# Patient Record
Sex: Female | Born: 1965 | Hispanic: No | Marital: Married | Smoking: Never smoker
Health system: Southern US, Community
[De-identification: ages and names within clinical notes are randomized; demographics above are authoritative.]

## PROBLEM LIST (undated history)

## (undated) DIAGNOSIS — I10 Essential (primary) hypertension: Secondary | ICD-10-CM

---

## 2019-07-01 ENCOUNTER — Observation Stay (HOSPITAL_BASED_OUTPATIENT_CLINIC_OR_DEPARTMENT_OTHER)
Admission: EM | Admit: 2019-07-01 | Discharge: 2019-07-02 | Disposition: A | Discharge status: Home / Self Care | Payer: Self-pay | Attending: Family Medicine | Admitting: Family Medicine

## 2019-07-01 ENCOUNTER — Other Ambulatory Visit: Payer: Self-pay

## 2019-07-01 ENCOUNTER — Encounter (HOSPITAL_BASED_OUTPATIENT_CLINIC_OR_DEPARTMENT_OTHER): Payer: Self-pay | Admitting: Emergency Medicine

## 2019-07-01 ENCOUNTER — Emergency Department (HOSPITAL_BASED_OUTPATIENT_CLINIC_OR_DEPARTMENT_OTHER): Payer: Self-pay

## 2019-07-01 DIAGNOSIS — Z79899 Other long term (current) drug therapy: Secondary | ICD-10-CM | POA: Insufficient documentation

## 2019-07-01 DIAGNOSIS — Z8616 Personal history of COVID-19: Secondary | ICD-10-CM | POA: Insufficient documentation

## 2019-07-01 DIAGNOSIS — A419 Sepsis, unspecified organism: Principal | ICD-10-CM | POA: Insufficient documentation

## 2019-07-01 DIAGNOSIS — R6510 Systemic inflammatory response syndrome (SIRS) of non-infectious origin without acute organ dysfunction: Secondary | ICD-10-CM | POA: Diagnosis present

## 2019-07-01 DIAGNOSIS — E860 Dehydration: Secondary | ICD-10-CM | POA: Diagnosis present

## 2019-07-01 DIAGNOSIS — N390 Urinary tract infection, site not specified: Secondary | ICD-10-CM | POA: Diagnosis present

## 2019-07-01 DIAGNOSIS — Z20822 Contact with and (suspected) exposure to covid-19: Secondary | ICD-10-CM | POA: Insufficient documentation

## 2019-07-01 DIAGNOSIS — N12 Tubulo-interstitial nephritis, not specified as acute or chronic: Secondary | ICD-10-CM | POA: Insufficient documentation

## 2019-07-01 DIAGNOSIS — R1013 Epigastric pain: Secondary | ICD-10-CM

## 2019-07-01 DIAGNOSIS — I10 Essential (primary) hypertension: Secondary | ICD-10-CM | POA: Diagnosis present

## 2019-07-01 DIAGNOSIS — R509 Fever, unspecified: Secondary | ICD-10-CM | POA: Diagnosis present

## 2019-07-01 HISTORY — DX: Essential (primary) hypertension: I10

## 2019-07-01 LAB — CBC WITH DIFFERENTIAL/PLATELET
Abs Immature Granulocytes: 0.1 10*3/uL — ABNORMAL HIGH (ref 0.00–0.07)
Basophils Absolute: 0 10*3/uL (ref 0.0–0.1)
Basophils Relative: 0 %
Eosinophils Absolute: 0.1 10*3/uL (ref 0.0–0.5)
Eosinophils Relative: 1 %
HCT: 39.2 % (ref 36.0–46.0)
Hemoglobin: 12.9 g/dL (ref 12.0–15.0)
Immature Granulocytes: 1 %
Lymphocytes Relative: 9 %
Lymphs Abs: 0.9 10*3/uL (ref 0.7–4.0)
MCH: 26.8 pg (ref 26.0–34.0)
MCHC: 32.9 g/dL (ref 30.0–36.0)
MCV: 81.3 fL (ref 80.0–100.0)
Monocytes Absolute: 0.1 10*3/uL (ref 0.1–1.0)
Monocytes Relative: 1 %
Neutro Abs: 8.4 10*3/uL — ABNORMAL HIGH (ref 1.7–7.7)
Neutrophils Relative %: 88 %
Platelets: 213 10*3/uL (ref 150–400)
RBC: 4.82 MIL/uL (ref 3.87–5.11)
RDW: 13.7 % (ref 11.5–15.5)
WBC: 9.5 10*3/uL (ref 4.0–10.5)
nRBC: 0 % (ref 0.0–0.2)

## 2019-07-01 LAB — PROTIME-INR
INR: 1 (ref 0.8–1.2)
Prothrombin Time: 13.4 seconds (ref 11.4–15.2)

## 2019-07-01 LAB — COMPREHENSIVE METABOLIC PANEL
ALT: 22 U/L (ref 0–44)
ALT: 20 U/L (ref 0–44)
AST: 24 U/L (ref 15–41)
AST: 20 U/L (ref 15–41)
Albumin: 3.2 g/dL — ABNORMAL LOW (ref 3.5–5.0)
Albumin: 2.6 g/dL — ABNORMAL LOW (ref 3.5–5.0)
Alkaline Phosphatase: 50 U/L (ref 38–126)
Alkaline Phosphatase: 38 U/L (ref 38–126)
Anion gap: 13 (ref 5–15)
Anion gap: 7 (ref 5–15)
BUN: 21 mg/dL — ABNORMAL HIGH (ref 6–20)
BUN: 13 mg/dL (ref 6–20)
CO2: 27 mmol/L (ref 22–32)
CO2: 23 mmol/L (ref 22–32)
Calcium: 9.1 mg/dL (ref 8.9–10.3)
Calcium: 7.7 mg/dL — ABNORMAL LOW (ref 8.9–10.3)
Chloride: 94 mmol/L — ABNORMAL LOW (ref 98–111)
Chloride: 112 mmol/L — ABNORMAL HIGH (ref 98–111)
Creatinine, Ser: 1.03 mg/dL — ABNORMAL HIGH (ref 0.44–1.00)
Creatinine, Ser: 0.76 mg/dL (ref 0.44–1.00)
GFR calc Af Amer: >60 mL/min (ref >60)
GFR calc Af Amer: >60 mL/min (ref >60)
GFR calc non Af Amer: >60 mL/min (ref >60)
GFR calc non Af Amer: >60 mL/min (ref >60)
Glucose, Bld: 108 mg/dL — ABNORMAL HIGH (ref 70–99)
Glucose, Bld: 125 mg/dL — ABNORMAL HIGH (ref 70–99)
Potassium: 3.6 mmol/L (ref 3.5–5.1)
Potassium: 3.4 mmol/L — ABNORMAL LOW (ref 3.5–5.1)
Sodium: 134 mmol/L — ABNORMAL LOW (ref 135–145)
Sodium: 142 mmol/L (ref 135–145)
Total Bilirubin: 1.5 mg/dL — ABNORMAL HIGH (ref 0.3–1.2)
Total Bilirubin: 1 mg/dL (ref 0.3–1.2)
Total Protein: 6.7 g/dL (ref 6.5–8.1)
Total Protein: 5.2 g/dL — ABNORMAL LOW (ref 6.5–8.1)

## 2019-07-01 LAB — URINALYSIS, ROUTINE W REFLEX MICROSCOPIC
Bilirubin Urine: NEGATIVE
Glucose, UA: NEGATIVE mg/dL
Ketones, ur: NEGATIVE mg/dL
Nitrite: NEGATIVE
Protein, ur: NEGATIVE mg/dL
Specific Gravity, Urine: <1.005 — ABNORMAL LOW (ref 1.005–1.030)
pH: 7 (ref 5.0–8.0)

## 2019-07-01 LAB — PREGNANCY, URINE: Preg Test, Ur: NEGATIVE

## 2019-07-01 LAB — SARS CORONAVIRUS 2 (TAT 6-24 HRS): SARS Coronavirus 2: NEGATIVE

## 2019-07-01 LAB — MAGNESIUM: Magnesium: 1.7 mg/dL (ref 1.7–2.4)

## 2019-07-01 LAB — CK: Total CK: 107 U/L (ref 38–234)

## 2019-07-01 LAB — LACTIC ACID, PLASMA
Lactic Acid, Venous: 2.1 mmol/L (ref 0.5–1.9)
Lactic Acid, Venous: 1.2 mmol/L (ref 0.5–1.9)
Lactic Acid, Venous: 0.9 mmol/L (ref 0.5–1.9)
Lactic Acid, Venous: 0.8 mmol/L (ref 0.5–1.9)

## 2019-07-01 LAB — APTT: aPTT: 35 seconds (ref 24–36)

## 2019-07-01 LAB — URINALYSIS, MICROSCOPIC (REFLEX): WBC, UA: >50 WBC/hpf (ref 0–5)

## 2019-07-01 LAB — SARS CORONAVIRUS 2 AG (30 MIN TAT): SARS Coronavirus 2 Ag: NEGATIVE

## 2019-07-01 LAB — GLUCOSE, CAPILLARY: Glucose-Capillary: 96 mg/dL (ref 70–99)

## 2019-07-01 LAB — PROCALCITONIN: Procalcitonin: 14.98 ng/mL

## 2019-07-01 LAB — MRSA PCR SCREENING: MRSA by PCR: NEGATIVE

## 2019-07-01 LAB — LIPASE, BLOOD: Lipase: 27 U/L (ref 11–51)

## 2019-07-01 MED ORDER — ACETAMINOPHEN 500 MG PO TABS
1000.0000 mg | ORAL_TABLET | Freq: Once | ORAL | Status: AC
  Administered 2019-07-01: 1000 mg via ORAL
  Filled 2019-07-01: qty 2

## 2019-07-01 MED ORDER — INSULIN ASPART 100 UNIT/ML ~~LOC~~ SOLN
0.0000 [IU] | Freq: Three times a day (TID) | SUBCUTANEOUS | Status: DC
  Administered 2019-07-02 (×3): 1 [IU] via SUBCUTANEOUS

## 2019-07-01 MED ORDER — SODIUM CHLORIDE 0.9 % IV SOLN: Freq: Once | INTRAVENOUS | Status: AC

## 2019-07-01 MED ORDER — VANCOMYCIN HCL 10 G IV SOLR: 800.0000 mg | Freq: Once | INTRAVENOUS | Status: DC

## 2019-07-01 MED ORDER — SODIUM CHLORIDE 0.9 % IV BOLUS
1000.0000 mL | Freq: Once | INTRAVENOUS | Status: AC
  Administered 2019-07-01: 17:00:00 1000 mL via INTRAVENOUS

## 2019-07-01 MED ORDER — METRONIDAZOLE IN NACL 5-0.79 MG/ML-% IV SOLN
500.0000 mg | Freq: Once | INTRAVENOUS | Status: AC
  Administered 2019-07-01: 500 mg via INTRAVENOUS
  Filled 2019-07-01: qty 100

## 2019-07-01 MED ORDER — NOREPINEPHRINE 4 MG/250ML-% IV SOLN
2.0000 ug/min | INTRAVENOUS | Status: DC
  Administered 2019-07-01: 2 ug/min via INTRAVENOUS
  Filled 2019-07-01: qty 250

## 2019-07-01 MED ORDER — SODIUM CHLORIDE 0.9 % IV SOLN
INTRAVENOUS | Status: DC | PRN
  Administered 2019-07-01: 1000 mL via INTRAVENOUS

## 2019-07-01 MED ORDER — ACETAMINOPHEN 325 MG PO TABS
650.0000 mg | ORAL_TABLET | Freq: Four times a day (QID) | ORAL | Status: DC | PRN
  Administered 2019-07-02: 650 mg via ORAL
  Filled 2019-07-01: qty 2

## 2019-07-01 MED ORDER — ENOXAPARIN SODIUM 40 MG/0.4ML ~~LOC~~ SOLN
40.0000 mg | SUBCUTANEOUS | Status: DC
  Administered 2019-07-01: 22:00:00 40 mg via SUBCUTANEOUS
  Filled 2019-07-01: qty 0.4

## 2019-07-01 MED ORDER — SODIUM CHLORIDE 0.9 % IV SOLN
1.0000 g | INTRAVENOUS | Status: DC
  Administered 2019-07-01: 22:00:00 1 g via INTRAVENOUS
  Filled 2019-07-01 (×2): qty 10

## 2019-07-01 MED ORDER — SODIUM CHLORIDE 0.9 % IV BOLUS (SEPSIS)
1000.0000 mL | Freq: Once | INTRAVENOUS | Status: AC
  Administered 2019-07-01: 1000 mL via INTRAVENOUS

## 2019-07-01 MED ORDER — VANCOMYCIN HCL 500 MG IV SOLR
INTRAVENOUS | Status: AC
  Filled 2019-07-01: qty 500

## 2019-07-01 MED ORDER — SODIUM CHLORIDE 0.9 % IV SOLN
250.0000 mL | INTRAVENOUS | Status: DC
  Administered 2019-07-01: 250 mL via INTRAVENOUS

## 2019-07-01 MED ORDER — HYDROCODONE-ACETAMINOPHEN 5-325 MG PO TABS: 1.0000 | ORAL_TABLET | ORAL | Status: DC | PRN

## 2019-07-01 MED ORDER — SODIUM CHLORIDE 0.9 % IV SOLN: INTRAVENOUS | Status: DC

## 2019-07-01 MED ORDER — VANCOMYCIN HCL 1500 MG/300ML IV SOLN
1500.0000 mg | Freq: Once | INTRAVENOUS | Status: AC
  Administered 2019-07-01: 09:00:00 1500 mg via INTRAVENOUS
  Filled 2019-07-01: qty 300

## 2019-07-01 MED ORDER — ONDANSETRON HCL 4 MG/2ML IJ SOLN: 4.0000 mg | Freq: Four times a day (QID) | INTRAMUSCULAR | Status: DC | PRN

## 2019-07-01 MED ORDER — SODIUM CHLORIDE 0.9 % IV SOLN
2.0000 g | Freq: Three times a day (TID) | INTRAVENOUS | Status: DC
  Administered 2019-07-01: 14:00:00 2 g via INTRAVENOUS
  Filled 2019-07-01: qty 2

## 2019-07-01 MED ORDER — CHLORHEXIDINE GLUCONATE CLOTH 2 % EX PADS
6.0000 | MEDICATED_PAD | Freq: Every day | CUTANEOUS | Status: DC
  Administered 2019-07-01 – 2019-07-02 (×2): 6 via TOPICAL

## 2019-07-01 MED ORDER — ACETAMINOPHEN 650 MG RE SUPP: 650.0000 mg | Freq: Four times a day (QID) | RECTAL | Status: DC | PRN

## 2019-07-01 MED ORDER — SODIUM CHLORIDE 0.9% FLUSH
3.0000 mL | Freq: Two times a day (BID) | INTRAVENOUS | Status: DC
  Administered 2019-07-01 – 2019-07-02 (×2): 3 mL via INTRAVENOUS

## 2019-07-01 MED ORDER — VANCOMYCIN HCL IN DEXTROSE 1-5 GM/200ML-% IV SOLN: 1000.0000 mg | Freq: Once | INTRAVENOUS | Status: DC

## 2019-07-01 MED ORDER — VANCOMYCIN HCL 1000 MG IV SOLR
INTRAVENOUS | Status: AC
  Filled 2019-07-01: qty 1000

## 2019-07-01 MED ORDER — INSULIN ASPART 100 UNIT/ML ~~LOC~~ SOLN: 0.0000 [IU] | Freq: Every day | SUBCUTANEOUS | Status: DC

## 2019-07-01 MED ORDER — VANCOMYCIN HCL 750 MG/150ML IV SOLN
750.0000 mg | Freq: Two times a day (BID) | INTRAVENOUS | Status: DC
  Filled 2019-07-01: qty 150

## 2019-07-01 MED ORDER — ONDANSETRON HCL 4 MG PO TABS: 4.0000 mg | ORAL_TABLET | Freq: Four times a day (QID) | ORAL | Status: DC | PRN

## 2019-07-01 MED ORDER — VANCOMYCIN HCL 1000 MG IV SOLR
750.0000 mg | Freq: Once | INTRAVENOUS | Status: AC
  Administered 2019-07-01: 17:00:00 750 mg via INTRAVENOUS
  Filled 2019-07-01: qty 750

## 2019-07-01 MED ORDER — ENOXAPARIN SODIUM 30 MG/0.3ML ~~LOC~~ SOLN: 30.0000 mg | SUBCUTANEOUS | Status: DC

## 2019-07-01 MED ORDER — SODIUM CHLORIDE 0.9 % IV SOLN
2.0000 g | Freq: Once | INTRAVENOUS | Status: AC
  Administered 2019-07-01: 06:00:00 2 g via INTRAVENOUS
  Filled 2019-07-01: qty 2

## 2019-07-01 MED ORDER — IOHEXOL 300 MG/ML  SOLN
100.0000 mL | Freq: Once | INTRAMUSCULAR | Status: AC | PRN
  Administered 2019-07-01: 09:00:00 100 mL via INTRAVENOUS

## 2019-07-01 NOTE — Progress Notes (Signed)
Pharmacy Antibiotic Note  Laura Beltran is a 54 y.o. female admitted on 07/01/2019 with sepsis -source unknown.  Pharmacy has been consulted for Vancomycin and Cefepime dosing.  Temperature 100.6. Wt - 83.5 kg. WBC is within normal limits.  SCr is 1.03 with estimated CrCl of 84 mL/min.    Plan: Cefepime 2g IV x1 (as ordered) then 2g IV every 8 hours.  Vancomycin 1500 mg IV x1 then Vancomycin 750 mg IV Q 12 hrs.  Goal AUC 400-550. Expected AUC: 516.9 SCr used: 1.03 Monitor renal function, clinical status, and culture results   Weight: 83.5 kg (184 lb 1.4 oz)  Temp (24hrs), Avg:100.6 F (38.1 C), Min:100.6 F (38.1 C), Max:100.6 F (38.1 C)  No results for input(s): WBC, CREATININE, LATICACIDVEN, VANCOTROUGH, VANCOPEAK, VANCORANDOM, GENTTROUGH, GENTPEAK, GENTRANDOM, TOBRATROUGH, TOBRAPEAK, TOBRARND, AMIKACINPEAK, AMIKACINTROU, AMIKACIN in the last 168 hours.  CrCl cannot be calculated (No successful lab value found.).    No Known Allergies  Antimicrobials this admission: Vancomycin 4/20 >> Cefepime 4/20 >>  Dose adjustments this admission:   Microbiology results: 4/20 BCx:  4/20 UCx:   Thank you for allowing pharmacy to be a part of this patient's care.  Link Snuffer, PharmD, BCPS, BCCCP Clinical Pharmacist Please refer to [facility] for [facility] Pharmacy numbers 07/01/2019 5:44 AM

## 2019-07-01 NOTE — ED Notes (Signed)
Pt using bedside commode, able to stand/pivot with standby assist

## 2019-07-01 NOTE — ED Notes (Signed)
ED Provider at bedside. 

## 2019-07-01 NOTE — ED Provider Notes (Signed)
[facility] EMERGENCY DEPARTMENT Provider Note   CSN: [id number] Arrival date & time: 07/01/19  5726     History Chief Complaint  Patient presents with  . Fever    Laura Beltran is a 54 y.o. female.  HPI     This a 54 year old female with history of hypertension who presents with fever and chills.  Family member at the bedside translates.  Reports that she received her Covid vaccination 4 days ago.  She felt chilled all day yesterday.  She also reported epigastric abdominal pain that is nonradiating.  Not worsened with eating.  No nausea, vomiting, or diarrhea.  No cough or shortness of breath.  She was given over-the-counter fever reducer with some improvement of her symptoms.  Patient is unable to rate her pain for me at this time.  Pain is not worsened with food and seems to come and go.  Patient denies urinary symptoms or back pain.  Of note, patient did have COVID-19 in the summer of last year.  Past Medical History:  Diagnosis Date  . Hypertension     There are no problems to display for this patient.   History reviewed. No pertinent surgical history.   OB History   No obstetric history on file.     No family history on file.  Social History   Tobacco Use  . Smoking status: Never Smoker  . Smokeless tobacco: Never Used  Substance Use Topics  . Alcohol use: Never  . Drug use: Never    Home Medications Prior to Admission medications   Not on File    Allergies    Patient has no known allergies.  Review of Systems   Review of Systems  Constitutional: Positive for chills and fever.  Respiratory: Negative for cough and shortness of breath.   Cardiovascular: Negative for chest pain.  Gastrointestinal: Positive for abdominal pain. Negative for constipation, diarrhea, nausea and vomiting.  Genitourinary: Positive for dysuria.  Neurological: Negative for weakness.  All other systems reviewed and are negative.   Physical Exam Updated Vital  Signs BP (!) 97/46   Pulse (!) 101   Temp (!) 100.6 F (38.1 C) (Oral)   Resp 17   Ht 1.6 m (5\' 3" )   Wt 83.5 kg   SpO2 98%   BMI 32.61 kg/m   Physical Exam Vitals and nursing note reviewed.  Constitutional:      Appearance: She is well-developed. She is not ill-appearing.  HENT:     Head: Normocephalic and atraumatic.     Nose: Nose normal.     Mouth/Throat:     Mouth: Mucous membranes are moist.  Eyes:     Pupils: Pupils are equal, round, and reactive to light.  Cardiovascular:     Rate and Rhythm: Normal rate and regular rhythm.     Heart sounds: Normal heart sounds.  Pulmonary:     Effort: Pulmonary effort is normal. No respiratory distress.     Breath sounds: No wheezing.  Abdominal:     General: Bowel sounds are normal.     Palpations: Abdomen is soft.     Tenderness: There is abdominal tenderness. There is no guarding or rebound.  Musculoskeletal:     Cervical back: Neck supple.     Right lower leg: No edema.     Left lower leg: No edema.  Skin:    General: Skin is warm and dry.  Neurological:     Mental Status: She is alert and oriented to  person, place, and time.  Psychiatric:        Mood and Affect: Mood normal.     ED Results / Procedures / Treatments   Labs (all labs ordered are listed, but only abnormal results are displayed) Labs Reviewed  LACTIC ACID, PLASMA - Abnormal; Notable for the following components:      Result Value   Lactic Acid, Venous 2.1 (*)    All other components within normal limits  COMPREHENSIVE METABOLIC PANEL - Abnormal; Notable for the following components:   Sodium 134 (*)    Chloride 94 (*)    Glucose, Bld 108 (*)    BUN 21 (*)    Creatinine, Ser 1.03 (*)    Albumin 3.2 (*)    Total Bilirubin 1.5 (*)    All other components within normal limits  CBC WITH DIFFERENTIAL/PLATELET - Abnormal; Notable for the following components:   Neutro Abs 8.4 (*)    Abs Immature Granulocytes 0.10 (*)    All other components within  normal limits  CULTURE, BLOOD (ROUTINE X 2)  CULTURE, BLOOD (ROUTINE X 2)  URINE CULTURE  SARS CORONAVIRUS 2 AG (30 MIN TAT)  APTT  PROTIME-INR  LIPASE, BLOOD  LACTIC ACID, PLASMA  URINALYSIS, ROUTINE W REFLEX MICROSCOPIC  PREGNANCY, URINE    EKG EKG Interpretation  Date/Time:  Tuesday July 01 2019 06:05:22 EDT Ventricular Rate:  108 PR Interval:    QRS Duration: 77 QT Interval:  354 QTC Calculation: 475 R Axis:   68 Text Interpretation: Sinus tachycardia Confirmed by Thayer Jew [phone]) on 07/01/2019 6:25:12 AM   Radiology No results found.  Procedures Procedures (including critical care time)  CRITICAL CARE Performed by: Merryl Hacker   Total critical care time:40 minutes  Critical care time was exclusive of separately billable procedures and treating other patients.  Critical care was necessary to treat or prevent imminent or life-threatening deterioration.  Critical care was time spent personally by me on the following activities: development of treatment plan with patient and/or surrogate as well as nursing, discussions with consultants, evaluation of patient's response to treatment, examination of patient, obtaining history from patient or surrogate, ordering and performing treatments and interventions, ordering and review of laboratory studies, ordering and review of radiographic studies, pulse oximetry and re-evaluation of patient's condition.   Medications Ordered in ED Medications  sodium chloride 0.9 % bolus 1,000 mL (0 mLs Intravenous Stopped 07/01/19 0641)    And  sodium chloride 0.9 % bolus 1,000 mL (1,000 mLs Intravenous New Bag/Given 07/01/19 6295)    And  sodium chloride 0.9 % bolus 1,000 mL (1,000 mLs Intravenous New Bag/Given 07/01/19 0642)  metroNIDAZOLE (FLAGYL) IVPB 500 mg (500 mg Intravenous New Bag/Given 07/01/19 0645)  vancomycin (VANCOREADY) IVPB 1500 mg/300 mL (has no administration in time range)  vancomycin (VANCOREADY) IVPB 750  mg/150 mL (has no administration in time range)  ceFEPIme (MAXIPIME) 2 g in sodium chloride 0.9 % 100 mL IVPB (0 g Intravenous Stopped 07/01/19 0641)    ED Course  I have reviewed the triage vital signs and the nursing notes.  Pertinent labs & imaging results that were available during my care of the patient were reviewed by me and considered in my medical decision making (see chart for details).  Clinical Course as of Jun 30 2308  Tue Jul 01, 2019  0706 Patient reports she feels somewhat better.  No longer having abdominal pain.  Reports that she can provide a urine sample.  She has some  persistently soft blood pressures.  She is awake, alert, oriented.  Lactate is only 2.1.  Will request manual blood pressure to correspond.  Abdomen is benign.   [CH]  [id number] CT abdomen and pelvis interpreted by me as no gross abnormalities.  Awaiting radiology reading.   [MB]  1032 Final CT reading showing no acute findings.  Have paged the hospitalist regarding admission.  Levophed being started.   [MB]  1051 Discussed with Dr. Magdalen Spatz [facility] hospitalist.  He he will admit the patient.  He does not feel the patient needs pressors currently.   [MB]    Clinical Course User Index [CH] Tarry Fountain, Mayer Masker, MD [MB] Terrilee Files, MD   MDM Rules/Calculators/A&P                       Patient presents with chills and abdominal pain.  On initial evaluation she is febrile to 100.6, hypotensive, tachycardic.  For this reason, sepsis work-up was initiated.  She has mild tenderness to palpation in the right upper quadrant and epigastric region, no rebound or guarding or signs of peritonitis.  She was given 30 cc/kg fluid and broad-spectrum antibiotics given her hypotension.  Lactate is 2.1.  Initial lab work notable for slight metabolic derangement with slightly low sodium, chloride and slightly elevated creatinine, no baseline for comparison.  No significant leukocytosis.  Awaiting chest x-ray and urinalysis.  On my  wet read of chest x-ray, no obvious pneumonia or infiltrate, no pneumothorax.  If these are negative, patient may need abdominal imaging to rule out infection.  Final Clinical Impression(s) / ED Diagnoses Final diagnoses:  Febrile illness  Epigastric pain    Rx / DC Orders ED Discharge Orders    None       Shon Baton, MD 07/01/19 2310

## 2019-07-01 NOTE — ED Notes (Signed)
Pt's native language is [employer], museum

## 2019-07-01 NOTE — ED Notes (Signed)
Patient transported to CT 

## 2019-07-01 NOTE — ED Triage Notes (Signed)
Pt with fever and chills. Pt had Covid vaccine x 4 days ago.

## 2019-07-01 NOTE — ED Provider Notes (Signed)
Signout from Dr. Dina Rich.  54 year old female history of hypertension here with fever.  Did have Covid vaccine 4 days ago.  Some mild epigastric discomfort.  Blood pressure was low here and lactate mildly elevated at 2.1.  Getting sepsis fluids and empiric antibiotics.  Plan is to follow-up on pending labs including UA.  Possible abdominal CT.  Will likely need admission for further management. Physical Exam  BP (!) 90/49   Pulse 96   Temp (!) 100.6 F (38.1 C) (Oral)   Resp (!) 26   Ht 5' 3"  (1.6 m)   Wt 83.5 kg   SpO2 99%   BMI 32.61 kg/m   Physical Exam  ED Course/Procedures   Clinical Course as of Jun 30 1157  Tue Jul 01, 2019  0706 Patient reports she feels somewhat better.  No longer having abdominal pain.  Reports that she can provide a urine sample.  She has some persistently soft blood pressures.  She is awake, alert, oriented.  Lactate is only 2.1.  Will request manual blood pressure to correspond.  Abdomen is benign.   [CH]  [id number] CT abdomen and pelvis interpreted by me as no gross abnormalities.  Awaiting radiology reading.   [MB]  1032 Final CT reading showing no acute findings.  Have paged the hospitalist regarding admission.  Levophed being started.   [MB]  9379 Discussed with Dr. Rance Muir [facility] hospitalist.  He he will admit the patient.  He does not feel the patient needs pressors currently.   [MB]    Clinical Course User Index [CH] Horton, Barbette Hair, MD [MB] Hayden Rasmussen, MD    .Critical Care Performed by: Hayden Rasmussen, MD Authorized by: Hayden Rasmussen, MD   Critical care provider statement:    Critical care time (minutes):  45   Critical care time was exclusive of:  Separately billable procedures and treating other patients   Critical care was necessary to treat or prevent imminent or life-threatening deterioration of the following conditions:  Sepsis   Critical care was time spent personally by me on the following activities:  Discussions with  consultants, evaluation of patient's response to treatment, examination of patient, ordering and performing treatments and interventions, ordering and review of laboratory studies, pulse oximetry, re-evaluation of patient's condition, obtaining history from patient or surrogate, development of treatment plan with patient or surrogate, ordering and review of radiographic studies and review of old charts   I assumed direction of critical care for this patient from another provider in my specialty: yes      MDM  Urinalysis resulting with greater than 50 whites nitrite negative.  Likely source.  Blood pressure remains low.  Will proceed with CT abdomen and pelvis to evaluate for possible other source.  9 AM.  Blood pressure continues to drop.  Patient not really symptomatic.  She is had almost her full 3 L of sepsis fluids.  We will start peripheral levo.  Met with the patient.  Family member is translating.  Nurse that said she tried to use the iPad interpreter without success.  Patient states she is feeling improved.  She says she still feels dehydrated.  Patient being packed for CT.  She is awake and alert.  She says her blood pressure is usually 140 at her doctor's office.  She takes a pill for sugar? and a pill for blood pressure and some type of blood thinner as preventative since Covid.  These medications are from [place].  Nurse trying to  reconcile her meds.  Patient cleared her lactate despite low blood pressures.  3:30 PM-patient was signed out to Dr. Rex Kras with plan for admission to [facility].  Antibiotics continuing.  Blood pressures remained stable off of pressors now.       Hayden Rasmussen, MD 07/01/19 1655

## 2019-07-01 NOTE — H&P (Signed)
Laura Beltran ZYS:[id number] DOB: December 24, 1965 DOA: 07/01/2019     PCP: Patient, No Pcp Per   Outpatient Specialists: NONE    Patient arrived to ER on 07/01/19 at 0517  Patient coming from: home Lives   With family    Chief Complaint:  Chief Complaint  Patient presents with  . Fever    HPI: Laura Beltran is a 54 y.o. female with medical history significant of hypertension, Covid-19 in July 2020, DM2   Presented with fever chills 4 days after receiving Covid vaccine on April 15 24h later she had some abdominal pain, had significant chills,  reports epigastric pain no nausea vomiting or diarrhea no cough no shortness of breath she has tried over-the-counter fever reducer and that has improved somewhat no urinary complaints no back pain, no dysuria Patient did have history of COVID-19 last summer she rosloycv (Rosuvastatin+Clopidogrel) was on hold 4 days prior to shot  Infectious risk factors:  Reports fever, abdominal pain,  Body aches,    Has  been vaccinated against COVID 1 shot   In  ER    PCR testing    COVID TEST  NEGATIVE   Lab Results  Component Value Date   [place] NEGATIVE 07/01/2019    Regarding pertinent Chronic problems:    Hyperlipidemia -  on statins   HTN on telma/d Telmisartan (44m) + Indapamide (1.514m      DM 2 - PO meds only,  Glucoryl MV2   Glimepiride (70m73m+ Metformin (500m97m Voglibose (0.70mg)11m obesity-   BMI Readings from Last 1 Encounters:  07/01/19 32.61 kg/m     While in ER: Sepsis protocol initiated started on vancomycin and cefepime and Flagyl Noted to be tachycardic slightly febrile up to 100.5 elevated lactic acid up to 2 Patient was given IV fluid bolus inittialy though to be in septic shock but BP improved with IV fluids No significant leukocytosis Chest x-ray unremarkable CT abdomen unremarkable  Initial hypotension down to 80's and she was started on levophed briefly  Now was able to be weaned  off  Hospitalist was called for admission for SIRS and dehydration due yo UTI  The following Work up has been ordered so far:  Orders Placed This Encounter  Procedures  . Critical Care  . Blood Culture (routine x 2)  . Urine culture  . SARS Coronavirus 2 Ag (30 min TAT) - Nasal Swab (BD Veritor Kit)  . SARS CORONAVIRUS 2 (TAT 6-24 HRS) Nasopharyngeal Nasopharyngeal Swab  . DG Chest Port 1 View  . CT Abdomen Pelvis W Contrast  . Lactic acid, plasma  . Comprehensive metabolic panel  . CBC WITH DIFFERENTIAL  . APTT  . Protime-INR  . Urinalysis, Routine w reflex microscopic  . Pregnancy, urine  . Lipase, blood  . Urinalysis, Microscopic (reflex)  . Diet NPO time specified  . Cardiac monitoring  . Refer to Sidebar Report: Sepsis Sidebar ED/IP  . Document vital signs within 1-hour of fluid bolus completion and notify provider of bolus completion  . Document height and weight  . Insert peripheral IV x 2  . Initiate Carrier Fluid Protocol  . Vasopressor infusion via peripheral IV access  . Assess IV site every 2 hours for vasopressor extravasation  . IV site should be re-confirmed by blood return or ultrasound every 24 hours of vasopressor administration  . Recommended time for peripheral administration of vasopressors is 24 hours  . Initiate Code Sepsis ([facility] [phone] activated, this  will prioritize pharmacy, lab, and radiology services for this patient for STAT collections and interventions.  . pharmacy consult  . ceFEPime (MAXIPIME) per pharmacy consult  . vancomycin per pharmacy consult  . pharmacy consult  . Consult to hospitalist  ALL PATIENTS BEING ADMITTED/HAVING PROCEDURES NEED COVID-19 SCREENING Diagnosis Sepsis  . Airborne and Contact precautions  . Pulse oximetry, continuous  . ED EKG 12-Lead  . EKG 12-Lead  . Place in observation (patient's expected length of stay will be less than 2 midnights)     Following Medications were ordered in  ER: Medications  0.9 %  sodium chloride infusion ( Intravenous Stopped 07/01/19 1316)  0.9 %  sodium chloride infusion (0 mLs Intravenous Stopped 07/01/19 1220)  ceFEPIme (MAXIPIME) 2 g in sodium chloride 0.9 % 100 mL IVPB ( Intravenous Stopped 07/01/19 1450)  norepinephrine (LEVOPHED) '4mg'$  in 272m premix infusion (0 mcg/min Intravenous Stopped 07/01/19 1105)  0.9 %  sodium chloride infusion ( Intravenous Rate/Dose Verify 07/01/19 1508)  vancomycin (VANCOREADY) IVPB 750 mg/150 mL (has no administration in time range)  vancomycin (VANCOCIN) 1000 MG powder (has no administration in time range)  sodium chloride 0.9 % bolus 1,000 mL (0 mLs Intravenous Stopped 07/01/19 0641)    And  sodium chloride 0.9 % bolus 1,000 mL (0 mLs Intravenous Stopped 07/01/19 0757)    And  sodium chloride 0.9 % bolus 1,000 mL (0 mLs Intravenous Stopped 07/01/19 0758)  ceFEPIme (MAXIPIME) 2 g in sodium chloride 0.9 % 100 mL IVPB (0 g Intravenous Stopped 07/01/19 0641)  metroNIDAZOLE (FLAGYL) IVPB 500 mg (0 mg Intravenous Stopped 07/01/19 0759)  vancomycin (VANCOREADY) IVPB 1500 mg/300 mL (0 mg Intravenous Stopped 07/01/19 1124)  iohexol (OMNIPAQUE) 300 MG/ML solution 100 mL (100 mLs Intravenous Contrast Given 07/01/19 0920)  vancomycin (VANCOCIN) 1000 MG powder (  [facility]  vancomycin (VANCOCIN) 500 MG powder (  [facility]  0.9 %  sodium chloride infusion ( Intravenous Stopped 07/01/19 1313)  acetaminophen (TYLENOL) tablet 1,000 mg (1,000 mg Oral Given 07/01/19 1511)  vancomycin (VANCOCIN) 750 mg in sodium chloride 0.9 % 250 mL IVPB (750 mg Intravenous New Bag/Given 07/01/19 1713)  sodium chloride 0.9 % bolus 1,000 mL (1,000 mLs Intravenous New Bag/Given 07/01/19 1711)        Consult Orders  (From admission, onward)         Start     Ordered   07/01/19 1018  Consult to hospitalist  ALL PATIENTS BEING ADMITTED/HAVING PROCEDURES NEED COVID-19 SCREENING Diagnosis Sepsis Called [facility]  spoke with TMarcello Moores Once    Comments: ALL PATIENTS BEING ADMITTED/HAVING PROCEDURES NEED COVID-19 SCREENING  Diagnosis Sepsis  Provider:  (Not yet assigned)  Question Answer Comment  Place call to: [facility]   Reason for Consult Admit      07/01/19 1017           Significant initial  Findings: Abnormal Labs Reviewed  LACTIC ACID, PLASMA - Abnormal; Notable for the following components:      Result Value   Lactic Acid, Venous 2.1 (*)    All other components within normal limits  COMPREHENSIVE METABOLIC PANEL - Abnormal; Notable for the following components:   Sodium 134 (*)    Chloride 94 (*)    Glucose, Bld 108 (*)    BUN 21 (*)    Creatinine, Ser 1.03 (*)    Albumin 3.2 (*)    Total Bilirubin 1.5 (*)    All other components within  normal limits  CBC WITH DIFFERENTIAL/PLATELET - Abnormal; Notable for the following components:   Neutro Abs 8.4 (*)    Abs Immature Granulocytes 0.10 (*)    All other components within normal limits  URINALYSIS, ROUTINE W REFLEX MICROSCOPIC - Abnormal; Notable for the following components:   Specific Gravity, Urine <1.005 (*)    Hgb urine dipstick SMALL (*)    Leukocytes,Ua MODERATE (*)    All other components within normal limits  URINALYSIS, MICROSCOPIC (REFLEX) - Abnormal; Notable for the following components:   Bacteria, UA MANY (*)    All other components within normal limits     Otherwise labs showing:    Recent Labs  Lab 07/01/19 0536  NA 134*  K 3.6  CO2 27  GLUCOSE 108*  BUN 21*  CREATININE 1.03*  CALCIUM 9.1    Cr    stable,   Lab Results  Component Value Date   CREATININE 1.03 (H) 07/01/2019    Recent Labs  Lab 07/01/19 0536  AST 24  ALT 22  ALKPHOS 50  BILITOT 1.5*  PROT 6.7  ALBUMIN 3.2*   Lab Results  Component Value Date   CALCIUM 9.1 07/01/2019      WBC      Component Value Date/Time   WBC 9.5 07/01/2019 0536   ANC    Component Value Date/Time   NEUTROABS 8.4 (H) 07/01/2019  0536    Plt: Lab Results  Component Value Date   PLT 213 07/01/2019    Lactic Acid, Venous    Component Value Date/Time   LATICACIDVEN 1.2 07/01/2019 0904    Procalcitonin 14.98   COVID-19 Labs    Lab Results  Component Value Date   SARSCOV2NAA NEGATIVE 07/01/2019     HG/HCT   stable,       Component Value Date/Time   HGB 12.9 07/01/2019 0536   HCT 39.2 07/01/2019 0536    Recent Labs  Lab 07/01/19 0536  LIPASE 27       ECG: Ordered Personally reviewed by me showing: HR : 108 Rhythm: Sinus tachycardia  no evidence of ischemic changes QTC 475     UA possible UTI      Urine analysis:    Component Value Date/Time   COLORURINE YELLOW 07/01/2019 0730   APPEARANCEUR CLEAR 07/01/2019 0730   LABSPEC <1.005 (L) 07/01/2019 0730   PHURINE 7.0 07/01/2019 0730   GLUCOSEU NEGATIVE 07/01/2019 0730   HGBUR SMALL (A) 07/01/2019 0730   BILIRUBINUR NEGATIVE 07/01/2019 0730   KETONESUR NEGATIVE 07/01/2019 0730   PROTEINUR NEGATIVE 07/01/2019 0730   NITRITE NEGATIVE 07/01/2019 0730   LEUKOCYTESUR MODERATE (A) 07/01/2019 0730       Ordered    CXR -  NON acute  CTabd/pelvis -  nonacute     ED Triage Vitals  Enc Vitals Group     BP 07/01/19 0525 (!) 88/47     Pulse Rate 07/01/19 0525 (!) 115     Resp 07/01/19 0525 16     Temp 07/01/19 0525 (!) 100.6 F (38.1 C)     Temp Source 07/01/19 0525 Oral     SpO2 07/01/19 0525 99 %     Weight 07/01/19 0522 184 lb 1.4 oz (83.5 kg)     Height 07/01/19 0628 _0  (1.6 m)     Head Circumference --      Peak Flow --      Pain Score 07/01/19 0726 0     Pain Loc --  Pain Edu? --      Excl. in [place]? --   TMAX(24)@       Latest  Blood pressure (!) 104/54, pulse 87, temperature 99.8 F (37.7 C), temperature source Oral, resp. rate (!) 24, height _0  (1.6 m), weight 83.5 kg, SpO2 98 %.    Review of Systems:    Pertinent positives include:  , Fevers, chills, fatigue,  dysuria  Constitutional:  No weight loss,  night sweats weight loss  HEENT:  No headaches, Difficulty swallowing,Tooth/dental problems,Sore throat,  No sneezing, itching, ear ache, nasal congestion, post nasal drip,  Cardio-vascular:  No chest pain, Orthopnea, PND, anasarca, dizziness, palpitations.no Bilateral lower extremity swelling  GI:  No heartburn, indigestion, abdominal pain, nausea, vomiting, diarrhea, change in bowel habits, loss of appetite, melena, blood in stool, hematemesis Resp:  no shortness of breath at rest. No dyspnea on exertion, No excess mucus, no productive cough, No non-productive cough, No coughing up of blood.No change in color of mucus.No wheezing. Skin:  no rash or lesions. No jaundice GU:  no, change in color of urine, no urgency or frequency. No straining to urinate.  No flank pain.  Musculoskeletal:  No joint pain or no joint swelling. No decreased range of motion. No back pain.  Psych:  No change in mood or affect. No depression or anxiety. No memory loss.  Neuro: no localizing neurological complaints, no tingling, no weakness, no double vision, no gait abnormality, no slurred speech, no confusion  All systems reviewed and apart from [place] all are negative  Past Medical History:   Past Medical History:  Diagnosis Date  . Hypertension    History reviewed. No pertinent surgical history.  Social History:  Ambulatory   Independently      reports that she has never smoked. She has never used smokeless tobacco. She reports that she does not drink alcohol or use drugs.    Family History:   Family History  Problem Relation Age of Onset  . Hypertension Neg Hx   . Diabetes Neg Hx     Allergies: No Known Allergies   Prior to Admission medications   Medication Sig Start Date End Date Taking? Authorizing Provider  telmisartan (MICARDIS) 40 MG tablet Take 40 mg by mouth daily.   Yes [provider]   Physical Exam: Blood pressure (!) 104/54, pulse 87, temperature 99.8 F (37.7  C), temperature source Oral, resp. rate (!) 24, height _1  (1.6 m), weight 83.5 kg, SpO2 98 %. 1. General:  in No  Acute distress   well-appearing 2. Psychological: Alert and Oriented 3. Head/ENT:    Dry Mucous Membranes                          Head Non traumatic, neck supple                           Poor Dentition 4. SKIN:  decreased Skin turgor,  Skin clean Dry and intact no rash 5. Heart: Regular rate and rhythm no  Murmur, no Rub or gallop 6. Lungs:   no wheezes or crackles   7. Abdomen: Soft, non-tender, Non distended obese   8. Lower extremities: no clubbing, cyanosis, no  edema 9. Neurologically Grossly intact, moving all 4 extremities equally   10. MSK: Normal range of motion   All other LABS:     Recent Labs  Lab 07/01/19 0536  WBC  9.5  NEUTROABS 8.4*  HGB 12.9  HCT 39.2  MCV 81.3  PLT 213     Recent Labs  Lab 07/01/19 0536  NA 134*  K 3.6  CL 94*  CO2 27  GLUCOSE 108*  BUN 21*  CREATININE 1.03*  CALCIUM 9.1     Recent Labs  Lab 07/01/19 0536  AST 24  ALT 22  ALKPHOS 50  BILITOT 1.5*  PROT 6.7  ALBUMIN 3.2*       Cultures: No results found for: SDES, SPECREQUEST, CULT, REPTSTATUS   Radiological Exams on Admission: CT Abdomen Pelvis W Contrast  Result Date: 07/01/2019 CLINICAL DATA:  Fever and chills question abdominal infection/abscess, had COVID vaccine 4 days ago EXAM: CT ABDOMEN AND PELVIS WITH CONTRAST TECHNIQUE: Multidetector CT imaging of the abdomen and pelvis was performed using the standard protocol following bolus administration of intravenous contrast. Sagittal and coronal MPR images reconstructed from axial data set. CONTRAST:  140m OMNIPAQUE IOHEXOL 300 MG/ML SOLN IV. No oral contrast. COMPARISON:  None FINDINGS: Lower chest: Lung bases clear Hepatobiliary: Gallbladder and liver normal appearance Pancreas: Normal appearance Spleen: Normal appearance Adrenals/Urinary Tract: Adrenal glands, kidneys, ureters, and bladder normal  appearance Stomach/Bowel: Normal appendix. Stomach and bowel loops normal appearance Vascular/Lymphatic: Vascular structures patent. Aorta normal caliber. Reproductive: Uterus unremarkable. Normal sized ovaries bilaterally without mass Other: No free air or free fluid. Tiny infraumbilical ventral hernia containing fat. No definite acute inflammatory process. Musculoskeletal: No acute osseous findings. Multilevel mild AP narrowing of spinal canal. BILATERAL neural foraminal stenoses L4-L5 and L5-S1 greater on RIGHT IMPRESSION: No definite acute intra-abdominal or intrapelvic abnormalities. Tiny infraumbilical hernia containing fat. Electronically Signed   By: MLavonia DanaM.D.   On: 07/01/2019 10:11   DG Chest Port 1 View  Result Date: 07/01/2019 CLINICAL DATA:  Fever and chills. COVID vaccine 4 days ago. EXAM: PORTABLE CHEST 1 VIEW COMPARISON:  None. FINDINGS: Heart size is exaggerated by low lung volumes. No edema or effusion is present. No focal airspace disease is present. Axial skeleton is unremarkable. IMPRESSION: 1. Low lung volumes. 2. No acute cardiopulmonary disease. Electronically Signed   By: CSan MorelleM.D.   On: 07/01/2019 06:48    Chart has been reviewed   Assessment/Plan  54y.o. female with medical history significant of hypertension, Covid-19 in 2020    Admitted for SIRS and dehydration due to possible UTI  Present on Admission:  . SIRS (systemic inflammatory response syndrome) (HCC) -  -Patient meets sepsis criteria with fever   Tachycardia  hypotension Pro calcitonin 14 Initial lactic acid Lactic Acid, Venous    Component Value Date/Time   LATICACIDVEN 1.2 07/01/2019 0904   Source most likely:  UTI,   response Pfizer vaccine is possible but give significant procalcitonin elevation bacterial infection more likely  -We will rehydrate, treat with IV antibiotics, follow lactic acid - Await results of blood and urine culture and adjust antibiotics as needed Patient  received initially broad-spectrum antibiotics most likely source being urinary.  We will continue still the Rocephin Vital signs improving rehydration - Obtain MRSA serologies    . Acute lower UTI -initially there was some concern the patient had some dysuria of back pain but this is more consistent chronic arthritis at this point per family patient denies any dysuria.  Urine with bacteria and leukocytosis.  Given initial presentation of possible sepsis we will continue IV antibiotics await results of urine culture   . Essential hypertension -hold home medications for now patient  was significantly hypotensive  . Dehydration -rehydrate and follow   DM 2 -   Order Sensitive  SSI     -  check TSH and HgA1C  - Hold by mouth medications     Prior history of Covid -patient was started on statin and Plavix combination medication post Covid she has no history of coronary artery disease or stenting family was told that the Plavix was used for prophylaxis of blood clots after Covid.  At this point patient is more than 9 months out from her initial infection.  Will discontinue Plavix  Other plan as per orders.  DVT prophylaxis:     Lovenox     Code Status:  FULL CODE  as per  family  I had personally discussed CODE STATUS with  Family   Family Communication:   Family not at  Bedside  plan of care was discussed on the phone with nephew  Disposition Plan:      To home once workup is complete and patient is stable   Following barriers for discharge:                               Afebrile, able to transition to PO antibiotics                             Will need to be able to tolerate PO                        Consults called: none  Admission status:  ED Disposition    ED Disposition Condition [place]: [place] [[id number]]  Level of Care: Stepdown [14]  Admit to SDU based on following criteria: Hemodynamic compromise or significant risk of  instability:  Patient requiring short term acute titration and management of vasoactive drips, and invasive monitoring (i.e., CVP and Arterial line).  Covid Evaluation: Asymptomatic Screening Protocol (No Symptoms)  Diagnosis: Fever [[id number]]  Admitting Physician: Terrilee Croak [[id number]]  Attending Physician: Terrilee Croak [[id number]]       Obs       Level of care      SDU tele indefinitely please discontinue once patient no longer qualifies   Precautions: admitted as   Covid Negative     PPE: Used by the provider:   P100  eye Goggles,  Gloves   Delrick Dehart 07/01/2019, 9:55 PM    [facility]     after 2 AM please page floor coverage PA If 7AM-7PM, please contact the day team taking care of the patient using [email or web address]   Patient was evaluated in the context of the global COVID-19 pandemic, which necessitated consideration that the patient might be at risk for infection with the SARS-CoV-2 virus that causes COVID-19. Institutional protocols and algorithms that pertain to the evaluation of patients at risk for COVID-19 are in a state of rapid change based on information released by regulatory bodies including the CDC and federal and state organizations. These policies and algorithms were followed during the patient's care.

## 2019-07-02 DIAGNOSIS — E118 Type 2 diabetes mellitus with unspecified complications: Secondary | ICD-10-CM

## 2019-07-02 LAB — HEMOGLOBIN A1C
Hgb A1c MFr Bld: 5.7 % — ABNORMAL HIGH (ref 4.8–5.6)
Mean Plasma Glucose: 116.89 mg/dL

## 2019-07-02 LAB — CBC WITH DIFFERENTIAL/PLATELET
Abs Immature Granulocytes: 0.04 10*3/uL (ref 0.00–0.07)
Basophils Absolute: 0 10*3/uL (ref 0.0–0.1)
Basophils Relative: 0 %
Eosinophils Absolute: 0.2 10*3/uL (ref 0.0–0.5)
Eosinophils Relative: 2 %
HCT: 36.7 % (ref 36.0–46.0)
Hemoglobin: 11.7 g/dL — ABNORMAL LOW (ref 12.0–15.0)
Immature Granulocytes: 0 %
Lymphocytes Relative: 13 %
Lymphs Abs: 1.5 10*3/uL (ref 0.7–4.0)
MCH: 27.3 pg (ref 26.0–34.0)
MCHC: 31.9 g/dL (ref 30.0–36.0)
MCV: 85.5 fL (ref 80.0–100.0)
Monocytes Absolute: 1.2 10*3/uL — ABNORMAL HIGH (ref 0.1–1.0)
Monocytes Relative: 11 %
Neutro Abs: 8.5 10*3/uL — ABNORMAL HIGH (ref 1.7–7.7)
Neutrophils Relative %: 74 %
Platelets: 189 10*3/uL (ref 150–400)
RBC: 4.29 MIL/uL (ref 3.87–5.11)
RDW: 14.1 % (ref 11.5–15.5)
WBC: 11.5 10*3/uL — ABNORMAL HIGH (ref 4.0–10.5)
nRBC: 0 % (ref 0.0–0.2)

## 2019-07-02 LAB — URINE CULTURE

## 2019-07-02 LAB — GLUCOSE, CAPILLARY
Glucose-Capillary: 121 mg/dL — ABNORMAL HIGH (ref 70–99)
Glucose-Capillary: 128 mg/dL — ABNORMAL HIGH (ref 70–99)
Glucose-Capillary: 137 mg/dL — ABNORMAL HIGH (ref 70–99)

## 2019-07-02 LAB — COMPREHENSIVE METABOLIC PANEL
ALT: 23 U/L (ref 0–44)
AST: 23 U/L (ref 15–41)
Albumin: 3 g/dL — ABNORMAL LOW (ref 3.5–5.0)
Alkaline Phosphatase: 44 U/L (ref 38–126)
Anion gap: 11 (ref 5–15)
BUN: 11 mg/dL (ref 6–20)
CO2: 22 mmol/L (ref 22–32)
Calcium: 8.2 mg/dL — ABNORMAL LOW (ref 8.9–10.3)
Chloride: 109 mmol/L (ref 98–111)
Creatinine, Ser: 0.76 mg/dL (ref 0.44–1.00)
GFR calc Af Amer: >60 mL/min (ref >60)
GFR calc non Af Amer: >60 mL/min (ref >60)
Glucose, Bld: 120 mg/dL — ABNORMAL HIGH (ref 70–99)
Potassium: 3.2 mmol/L — ABNORMAL LOW (ref 3.5–5.1)
Sodium: 142 mmol/L (ref 135–145)
Total Bilirubin: 0.7 mg/dL (ref 0.3–1.2)
Total Protein: 6.3 g/dL — ABNORMAL LOW (ref 6.5–8.1)

## 2019-07-02 LAB — PHOSPHORUS: Phosphorus: 1.8 mg/dL — ABNORMAL LOW (ref 2.5–4.6)

## 2019-07-02 LAB — HIV ANTIBODY (ROUTINE TESTING W REFLEX): HIV Screen 4th Generation wRfx: NONREACTIVE

## 2019-07-02 LAB — MAGNESIUM: Magnesium: 1.9 mg/dL (ref 1.7–2.4)

## 2019-07-02 LAB — TSH: TSH: 4.403 u[IU]/mL (ref 0.350–4.500)

## 2019-07-02 MED ORDER — K PHOS MONO-SOD PHOS DI & MONO 155-852-130 MG PO TABS
500.0000 mg | ORAL_TABLET | Freq: Four times a day (QID) | ORAL | Status: DC
  Administered 2019-07-02 (×3): 500 mg via ORAL
  Filled 2019-07-02 (×5): qty 2

## 2019-07-02 MED ORDER — SODIUM CHLORIDE 0.9 % IV SOLN
1.0000 g | INTRAVENOUS | Status: DC
  Filled 2019-07-02: qty 10

## 2019-07-02 MED ORDER — NORTRIPTYLINE HCL 10 MG PO CAPS
10.0000 mg | ORAL_CAPSULE | Freq: Every day | ORAL | Status: DC
  Filled 2019-07-02: qty 1

## 2019-07-02 MED ORDER — GABAPENTIN 400 MG PO CAPS: 400.0000 mg | ORAL_CAPSULE | Freq: Every day | ORAL | Status: DC

## 2019-07-02 MED ORDER — SULFAMETHOXAZOLE-TRIMETHOPRIM 800-160 MG PO TABS: 1.0000 | ORAL_TABLET | Freq: Two times a day (BID) | ORAL | 0 refills | Status: AC

## 2019-07-02 MED ORDER — ROSUVASTATIN CALCIUM 10 MG PO TABS
10.0000 mg | ORAL_TABLET | Freq: Every day | ORAL | Status: DC
  Administered 2019-07-02: 10 mg via ORAL
  Filled 2019-07-02: qty 1

## 2019-07-02 MED ORDER — SODIUM CHLORIDE 0.9 % IV SOLN
1.0000 g | INTRAVENOUS | Status: DC
  Administered 2019-07-02: 17:00:00 1 g via INTRAVENOUS
  Filled 2019-07-02: qty 1

## 2019-07-02 MED ORDER — PANTOPRAZOLE SODIUM 40 MG PO TBEC
40.0000 mg | DELAYED_RELEASE_TABLET | Freq: Every day | ORAL | Status: DC
  Administered 2019-07-02: 10:00:00 40 mg via ORAL
  Filled 2019-07-02: qty 1

## 2019-07-02 NOTE — Discharge Summary (Signed)
Physician Discharge Summary  Brenetta Penny LPF:[id number] DOB: 06-12-1965 DOA: 07/01/2019  PCP: Patient, No Pcp Per  Admit date: 07/01/2019 Discharge date: 07/02/2019  Admitted From: Home  Disposition:  Home   Recommendations for Outpatient Follow-up:  1. Follow up with PCP when established locally     Home Health: None  Equipment/Devices: None  Discharge Condition: Good  CODE STATUS: FULL Diet recommendation: Diabetic  Brief/Interim Summary: Mrs. Assunta Gambles is a 54 y.o. F with HTN, DM, and COVID-19 one year ago who presented with fever, chills, epigastric pain for 1 day.  In the ER, LFTs and CT abdomen and pelvis unremakrable.  CXR clear.  UA showed bacteria and urine culture growing multiple species.  She was initially hypotensive requiring IV fluids and brief Levophed, but BP normalized quickly and Levophed was stopped       PRINCIPAL HOSPITAL DIAGNOSIS: Mild sepsis due to UTI    Discharge Diagnoses:   Sepsis due to UTI Patient presented with hypotension, lactate 2.1 and UA suggestive of infection.  Treated with empiric vancomycin, cefepime and Flagyl x1 in the ER, transitioned to ceftriaxone on admission.  Overnight, improved to normal.  Blood cultures negative.  Patient without symptoms, no further fever, desiring to go home.  Hemodynamically stable, mentating well, taking orals well.  Reviewed return precautions carefully.    Discharged to complete 7 days antibiotics with Bactrim.      Hypertension Resume telmisartan and indapamide in 1 week  Prior COVID infection Patient taking Crestor, clopidogrel for DVT prevention post-COVID.  Given elapsed time and no personal history of CV disease, this is safe to discontinue.  Diabetes Continue metformin and sulfonylurea.      Discharge Instructions  Discharge Instructions    Discharge instructions   Complete by: As directed    From Dr. Loleta Books: You were admitted with a fever and chills and low blood  pressure.  This appeared to be from a urinary tract infection and dehydration.   You were treated with IV fluids for dehydration and low blood pressure and your blood pressure normalized.  You were treated with antibiotics for your infection.  You should complete the course of antibiotics with Bactrim DS (trimethoprim-sulfamethoxazole)  Start Bactrim tomorrow and take 1 tab twice daily for 5 days  If you have minor aches and pains, or headache, take Tylenol  If you have fever, severe chills, weakness or are unable to swallow anything, return to the ER.  Find a primary care doctor here in [place] if you are planning to stay for several months.  For the next week: do not take your Telma-D You can resume it in 1 week  If you have never had a heart attack or stroke or stent, you may safely stop the Rosloy-CV    Resume your normal home diabetes medicines   Increase activity slowly   Complete by: As directed      Allergies as of 07/02/2019   No Known Allergies     Medication List    STOP taking these medications   clopidogrel 75 MG tablet Commonly known as: PLAVIX   rosuvastatin 10 MG tablet Commonly known as: CRESTOR     TAKE these medications   gabapentin 400 MG capsule Commonly known as: NEURONTIN Take 400 mg by mouth at bedtime.   glimepiride 2 MG tablet Commonly known as: AMARYL Take 2 mg by mouth daily with breakfast.   indapamide 1.25 MG tablet Commonly known as: LOZOL Take 1.25 mg by mouth daily.   metFORMIN  500 MG tablet Commonly known as: GLUCOPHAGE Take 500 mg by mouth daily with breakfast.   nortriptyline 10 MG capsule Commonly known as: PAMELOR Take 10 mg by mouth at bedtime.   OVER THE COUNTER MEDICATION Take 1 tablet by mouth daily. Combo pill from [place] named (ACTISC2) ( Includes Glucosamine Sulfate/ Sodium Hyaluronate/ Collagen Peptides)   pantoprazole 40 MG tablet Commonly known as: PROTONIX Take 40 mg by mouth daily.   PRESCRIPTION  MEDICATION Take 0.2 mg by mouth daily with breakfast. Voglibose 0.1m (Combo pill from [facility] Glucoryl MV2)   PRESCRIPTION MEDICATION Take 1 tablet by mouth daily. Prescription med from [facility] (VitBone-K2) Includes Calcium Carbonate, Calcitrol & Vitamin K2)   PRESCRIPTION MEDICATION Take 1 tablet by mouth daily. Prescription med from [facility] N[facility] (Zerodol TH4) (Includes Aceclofenac 1043m Thiocolchicoside 63m41m  PRESCRIPTION MEDICATION Take 1 tablet by mouth daily. Prescription med from [facility] (Includes Diacerein and Glucosamine)   PRESCRIPTION MEDICATION Take 1 tablet by mouth daily. Prescription med from [facility] (Ulpan-DSR) includes Domperidone 41m50md Pantoprazole 40mg25msulfamethoxazole-trimethoprim 800-160 MG tablet Commonly known as: BACTRIM DS Take 1 tablet by mouth 2 (two) times daily.   telmisartan 40 MG tablet Commonly known as: MICARDIS Take 40 mg by mouth daily.       No Known Allergies  Consultations:     Procedures/Studies: CT Abdomen Pelvis W Contrast  Result Date: 07/01/2019 CLINICAL DATA:  Fever and chills question abdominal infection/abscess, had COVID vaccine 4 days ago EXAM: CT ABDOMEN AND PELVIS WITH CONTRAST TECHNIQUE: Multidetector CT imaging of the abdomen and pelvis was performed using the standard protocol following bolus administration of intravenous contrast. Sagittal and coronal MPR images reconstructed from axial data set. CONTRAST:  100mL 39mPAQUE IOHEXOL 300 MG/ML SOLN IV. No oral contrast. COMPARISON:  None FINDINGS: Lower chest: Lung bases clear Hepatobiliary: Gallbladder and liver normal appearance Pancreas: Normal appearance Spleen: Normal appearance Adrenals/Urinary Tract: Adrenal glands, kidneys, ureters, and bladder normal appearance Stomach/Bowel: Normal appendix. Stomach and bowel loops normal appearance Vascular/Lymphatic: Vascular structures patent. Aorta normal caliber. Reproductive: Uterus unremarkable.  Normal sized ovaries bilaterally without mass Other: No free air or free fluid. Tiny infraumbilical ventral hernia containing fat. No definite acute inflammatory process. Musculoskeletal: No acute osseous findings. Multilevel mild AP narrowing of spinal canal. BILATERAL neural foraminal stenoses L4-L5 and L5-S1 greater on RIGHT IMPRESSION: No definite acute intra-abdominal or intrapelvic abnormalities. Tiny infraumbilical hernia containing fat. Electronically Signed   By: Mark  Lavonia Dana  On: 07/01/2019 10:11   DG Chest Port 1 View  Result Date: 07/01/2019 CLINICAL DATA:  Fever and chills. COVID vaccine 4 days ago. EXAM: PORTABLE CHEST 1 VIEW COMPARISON:  None. FINDINGS: Heart size is exaggerated by low lung volumes. No edema or effusion is present. No focal airspace disease is present. Axial skeleton is unremarkable. IMPRESSION: 1. Low lung volumes. 2. No acute cardiopulmonary disease. Electronically Signed   By: ChristSan Morelle  On: 07/01/2019 06:48       Subjective: Feeling well.  No back pain, no fever, no dysuria, no vomiting, no confusion.  Discharge Exam: Vitals:   07/02/19 1430 07/02/19 1439  BP:  122/68  Pulse:  86  Resp:  16  Temp: 98 F (36.7 C) 99.2 F (37.3 C)  SpO2:  100%   Vitals:   07/02/19 1000 07/02/19 1044 07/02/19 1430 07/02/19 1439  BP: 126/77 109/66  122/68  Pulse: 87 89  86  Resp: 17 16  16  Temp:  99.1 F (37.3 C) 98 F (36.7 C) 99.2 F (37.3 C)  TempSrc:  Oral  Oral  SpO2: 99% 99%  100%  Weight:      Height:        General: Pt is alert, awake, not in acute distress Cardiovascular: RRR, nl S1-S2, no murmurs appreciated.   No LE edema.   Respiratory: Normal respiratory rate and rhythm.  CTAB without rales or wheezes. Abdominal: Abdomen soft and non-tender.  No distension or HSM.   Neuro/Psych: Strength symmetric in upper and lower extremities.  Judgment and insight appear normal.   The results of significant diagnostics from this  hospitalization (including imaging, microbiology, ancillary and laboratory) are listed below for reference.     Microbiology: Recent Results (from the past 240 hour(s))  Blood Culture (routine x 2)     Status: None (Preliminary result)   Collection Time: 07/01/19  5:36 AM   Specimen: BLOOD RIGHT ARM  Result Value Ref Range Status   Specimen Description   Final    BLOOD RIGHT ARM Performed at [facility], [address] [place], [place] [address]    Special Requests   Final    BOTTLES DRAWN AEROBIC AND ANAEROBIC Blood Culture adequate volume Performed at [facility], [address], [place], [place] [address]    Culture   Final    NO GROWTH 1 DAY Performed at [facility], [place] [address] [place], [place] [address]    Report Status PENDING  Incomplete  Blood Culture (routine x 2)     Status: None (Preliminary result)   Collection Time: 07/01/19  5:53 AM   Specimen: BLOOD RIGHT FOREARM  Result Value Ref Range Status   Specimen Description   Final    BLOOD RIGHT FOREARM Performed at [facility], [address] [place], [place] [address]    Special Requests   Final    BOTTLES DRAWN AEROBIC AND ANAEROBIC Blood Culture adequate volume Performed at [facility], [address], [place], [place] [address]    Culture   Final    NO GROWTH 1 DAY Performed at [facility], [place] [address], [place], [place] [address]    Report Status PENDING  Incomplete  Urine culture     Status: Abnormal   Collection Time: 07/01/19  7:28 AM   Specimen: In/Out Cath Urine  Result Value Ref Range Status   Specimen Description   Final    IN/OUT CATH URINE Performed at [facility], [address] [place], [place] [address]    Special Requests   Final    NONE Performed at [facility], [address] [place], [place] [address]    Culture MULTIPLE SPECIES PRESENT, SUGGEST RECOLLECTION (A)  Final   Report Status  07/02/2019 FINAL  Final  SARS Coronavirus 2 Ag (30 min TAT) - Nasal Swab (BD Veritor Kit)     Status: None   Collection Time: 07/01/19  7:31 AM   Specimen: Nasal Swab (BD Veritor Kit)  Result Value Ref Range Status   SARS Coronavirus 2 Ag NEGATIVE NEGATIVE Final    Comment: (NOTE) SARS-CoV-2 antigen NOT DETECTED.  Negative results are presumptive.  Negative results do not preclude SARS-CoV-2 infection and should not be used as the sole basis for treatment or other patient management decisions, including infection  control decisions, particularly in the presence of  clinical signs and  symptoms consistent with COVID-19, or in those who have been in contact with the virus.  Negative results must be combined with clinical observations, patient history, and epidemiological information. The expected result is Negative. Fact Sheet for Patients: PodPark.tn Fact Sheet for Healthcare Providers: GiftContent.is This test is not yet approved or cleared by the [place] FDA and  has been authorized for detection and/or diagnosis of SARS-CoV-2 by FDA under an Emergency Use Authorization (EUA).  This EUA will remain in effect (meaning this test can be used) for the duration of  the COVID-19 de claration under Section 564(b)(1) of the Act, 21 U.S.C. section 360bbb-3(b)(1), unless the authorization is terminated or revoked sooner. Performed at [facility], [address] [place], [place] [address]   SARS CORONAVIRUS 2 (TAT 6-24 HRS) Nasopharyngeal Nasopharyngeal Swab     Status: None   Collection Time: 07/01/19  9:40 AM   Specimen: Nasopharyngeal Swab  Result Value Ref Range Status   SARS Coronavirus 2 NEGATIVE NEGATIVE Final    Comment: (NOTE) SARS-CoV-2 target nucleic acids are NOT DETECTED. The SARS-CoV-2 RNA is generally detectable in upper and lower respiratory specimens during the acute phase of infection.  Negative results do not preclude SARS-CoV-2 infection, do not rule out co-infections with other pathogens, and should not be used as the sole basis for treatment or other patient management decisions. Negative results must be combined with clinical observations, patient history, and epidemiological information. The expected result is Negative. Fact Sheet for Patients: SugarRoll.be Fact Sheet for Healthcare Providers: https://www.woods-mathews.com/ This test is not yet approved or cleared by the [place] FDA and  has been authorized for detection and/or diagnosis of SARS-CoV-2 by FDA under an Emergency Use Authorization (EUA). This EUA will remain  in effect (meaning this test can be used) for the duration of the COVID-19 declaration under Section 56 4(b)(1) of the Act, 21 U.S.C. section 360bbb-3(b)(1), unless the authorization is terminated or revoked sooner. Performed at [facility], [place] [address] [place], [place] [address]   MRSA PCR Screening     Status: None   Collection Time: 07/01/19  6:00 PM   Specimen: Nasal Mucosa; Nasopharyngeal  Result Value Ref Range Status   MRSA by PCR NEGATIVE NEGATIVE Final    Comment:        The GeneXpert MRSA Assay (FDA approved for NASAL specimens only), is one component of a comprehensive MRSA colonization surveillance program. It is not intended to diagnose MRSA infection nor to guide or monitor treatment for MRSA infections. Performed at [facility], [place] [address], [place], [place] [address]      Labs: BNP (last 3 results) No results for input(s): BNP in the last 8760 hours. Basic Metabolic Panel: Recent Labs  Lab 07/01/19 0536 07/01/19 2004 07/02/19 0245  NA 134* 142 142  K 3.6 3.4* 3.2*  CL 94* 112* 109  CO2 27 23 22   GLUCOSE 108* 125* 120*  BUN 21* 13 11  CREATININE 1.03* 0.76 0.76  CALCIUM 9.1 7.7* 8.2*  MG  --  1.7 1.9  PHOS  --   --  1.8*    Liver Function Tests: Recent Labs  Lab 07/01/19 0536 07/01/19 2004 07/02/19 0245  AST 24 20 23   ALT 22 20 23   ALKPHOS 50 38 44  BILITOT 1.5* 1.0 0.7  PROT 6.7 5.2* 6.3*  ALBUMIN 3.2* 2.6* 3.0*   Recent Labs  Lab 07/01/19 0536  LIPASE 27  No results for input(s): AMMONIA in the last 168 hours. CBC: Recent Labs  Lab 07/01/19 0536 07/02/19 0245  WBC 9.5 11.5*  NEUTROABS 8.4* 8.5*  HGB 12.9 11.7*  HCT 39.2 36.7  MCV 81.3 85.5  PLT 213 189   Cardiac Enzymes: Recent Labs  Lab 07/01/19 2004  CKTOTAL 107   BNP: Invalid input(s): POCBNP CBG: Recent Labs  Lab 07/01/19 2102 07/02/19 0726 07/02/19 1206  GLUCAP 96 121* 128*   D-Dimer No results for input(s): DDIMER in the last 72 hours. Hgb A1c Recent Labs    07/02/19 0245  HGBA1C 5.7*   Lipid Profile No results for input(s): CHOL, HDL, LDLCALC, TRIG, CHOLHDL, LDLDIRECT in the last 72 hours. Thyroid function studies Recent Labs    07/02/19 0245  TSH 4.403   Anemia work up No results for input(s): VITAMINB12, FOLATE, FERRITIN, TIBC, IRON, RETICCTPCT in the last 72 hours. Urinalysis    Component Value Date/Time   COLORURINE YELLOW 07/01/2019 0730   APPEARANCEUR CLEAR 07/01/2019 0730   LABSPEC <1.005 (L) 07/01/2019 0730   PHURINE 7.0 07/01/2019 0730   GLUCOSEU NEGATIVE 07/01/2019 0730   HGBUR SMALL (A) 07/01/2019 0730   BILIRUBINUR NEGATIVE 07/01/2019 0730   KETONESUR NEGATIVE 07/01/2019 0730   PROTEINUR NEGATIVE 07/01/2019 0730   NITRITE NEGATIVE 07/01/2019 0730   LEUKOCYTESUR MODERATE (A) 07/01/2019 0730   Sepsis Labs Invalid input(s): PROCALCITONIN,  WBC,  LACTICIDVEN Microbiology Recent Results (from the past 240 hour(s))  Blood Culture (routine x 2)     Status: None (Preliminary result)   Collection Time: 07/01/19  5:36 AM   Specimen: BLOOD RIGHT ARM  Result Value Ref Range Status   Specimen Description   Final    BLOOD RIGHT ARM Performed at [facility], [address] [place], [place] [address]    Special Requests   Final    BOTTLES DRAWN AEROBIC AND ANAEROBIC Blood Culture adequate volume Performed at [facility], [address] [place], [place] [address]    Culture   Final    NO GROWTH 1 DAY Performed at [facility], [place] [address], [place], [place] [address]    Report Status PENDING  Incomplete  Blood Culture (routine x 2)     Status: None (Preliminary result)   Collection Time: 07/01/19  5:53 AM   Specimen: BLOOD RIGHT FOREARM  Result Value Ref Range Status   Specimen Description   Final    BLOOD RIGHT FOREARM Performed at [facility], [address] [place], [place] [address]    Special Requests   Final    BOTTLES DRAWN AEROBIC AND ANAEROBIC Blood Culture adequate volume Performed at [facility], [address] [place], [place] [address]    Culture   Final    NO GROWTH 1 DAY Performed at [facility], [place] [address], [place], [place] [address]    Report Status PENDING  Incomplete  Urine culture     Status: Abnormal   Collection Time: 07/01/19  7:28 AM   Specimen: In/Out Cath Urine  Result Value Ref Range Status   Specimen Description   Final    IN/OUT CATH URINE Performed at [facility], [address] [place], [place] [address]    Special Requests   Final    NONE Performed at [facility], [address] [place], [place] [address]    Culture MULTIPLE SPECIES PRESENT,  SUGGEST RECOLLECTION (A)  Final   Report Status 07/02/2019 FINAL  Final  SARS Coronavirus 2 Ag (30 min TAT) - Nasal Swab (BD Veritor Kit)     Status: None   Collection Time: 07/01/19  7:31 AM   Specimen: Nasal Swab (BD Veritor Kit)  Result Value Ref Range Status   SARS Coronavirus 2 Ag NEGATIVE NEGATIVE Final    Comment: (NOTE) SARS-CoV-2 antigen NOT DETECTED.  Negative results are presumptive.  Negative results do not preclude SARS-CoV-2 infection and should not be used as the sole  basis for treatment or other patient management decisions, including infection  control decisions, particularly in the presence of clinical signs and  symptoms consistent with COVID-19, or in those who have been in contact with the virus.  Negative results must be combined with clinical observations, patient history, and epidemiological information. The expected result is Negative. Fact Sheet for Patients: PodPark.tn Fact Sheet for Healthcare Providers: GiftContent.is This test is not yet approved or cleared by the [place] FDA and  has been authorized for detection and/or diagnosis of SARS-CoV-2 by FDA under an Emergency Use Authorization (EUA).  This EUA will remain in effect (meaning this test can be used) for the duration of  the COVID-19 de claration under Section 564(b)(1) of the Act, 21 U.S.C. section 360bbb-3(b)(1), unless the authorization is terminated or revoked sooner. Performed at [facility], [address] [place], [place] [address]   SARS CORONAVIRUS 2 (TAT 6-24 HRS) Nasopharyngeal Nasopharyngeal Swab     Status: None   Collection Time: 07/01/19  9:40 AM   Specimen: Nasopharyngeal Swab  Result Value Ref Range Status   SARS Coronavirus 2 NEGATIVE NEGATIVE Final    Comment: (NOTE) SARS-CoV-2 target nucleic acids are NOT DETECTED. The SARS-CoV-2 RNA is generally detectable in upper and lower respiratory specimens during the acute phase of infection. Negative results do not preclude SARS-CoV-2 infection, do not rule out co-infections with other pathogens, and should not be used as the sole basis for treatment or other patient management decisions. Negative results must be combined with clinical observations, patient history, and epidemiological information. The expected result is Negative. Fact Sheet for Patients: SugarRoll.be Fact Sheet for Healthcare  Providers: https://www.woods-mathews.com/ This test is not yet approved or cleared by the [place] FDA and  has been authorized for detection and/or diagnosis of SARS-CoV-2 by FDA under an Emergency Use Authorization (EUA). This EUA will remain  in effect (meaning this test can be used) for the duration of the COVID-19 declaration under Section 56 4(b)(1) of the Act, 21 U.S.C. section 360bbb-3(b)(1), unless the authorization is terminated or revoked sooner. Performed at [facility], [place] [address], [place], [place] [address]   MRSA PCR Screening     Status: None   Collection Time: 07/01/19  6:00 PM   Specimen: Nasal Mucosa; Nasopharyngeal  Result Value Ref Range Status   MRSA by PCR NEGATIVE NEGATIVE Final    Comment:        The GeneXpert MRSA Assay (FDA approved for NASAL specimens only), is one component of a comprehensive MRSA colonization surveillance program. It is not intended to diagnose MRSA infection nor to guide or monitor treatment for MRSA infections. Performed at [facility], [place] [address], [place], [place] [address]      Time coordinating discharge: 35 minutes      SIGNED:   Edwin Dada, MD  [facility] 07/02/2019, 3:56 PM

## 2019-07-02 NOTE — Plan of Care (Signed)
Discussed with patient and family regarding urinary tract infection cause and effect on patient. Discussed also the IV antibiotics that the patient will receive.

## 2019-07-02 NOTE — Progress Notes (Signed)
Patient arrived from ICU. Patient is in no acute distress and is resting calmly in bed. Family at bedside. Will continue to monitor.

## 2019-07-03 LAB — URINE CULTURE: Culture: NO GROWTH

## 2019-07-06 LAB — CULTURE, BLOOD (ROUTINE X 2)
Culture: NO GROWTH
Culture: NO GROWTH
Special Requests: ADEQUATE
Special Requests: ADEQUATE

## 2021-02-16 IMAGING — CT CT ABD-PELV W/ CM
2 of 5 series · 16 of 46 positions shown, 18 images · IV contrast (Omnipaque)
Comparison: None

CLINICAL DATA: Fever and chills question abdominal
infection/abscess, had COVID vaccine 4 days ago

EXAM:
CT ABDOMEN AND PELVIS WITH CONTRAST
TECHNIQUE: Multidetector CT imaging of the abdomen and pelvis was performed
using the standard protocol following bolus administration of
intravenous contrast. Sagittal and coronal MPR images reconstructed
from axial data set.
CONTRAST:  100mL OMNIPAQUE IOHEXOL 300 MG/ML SOLN IV. No oral
contrast.

[Series 2: axial st · axial · 0.98mm/px · z∈[-480,-20]mm · 13 of 104 slices shown, 15 images]
[im 6/104  soft-tissue]
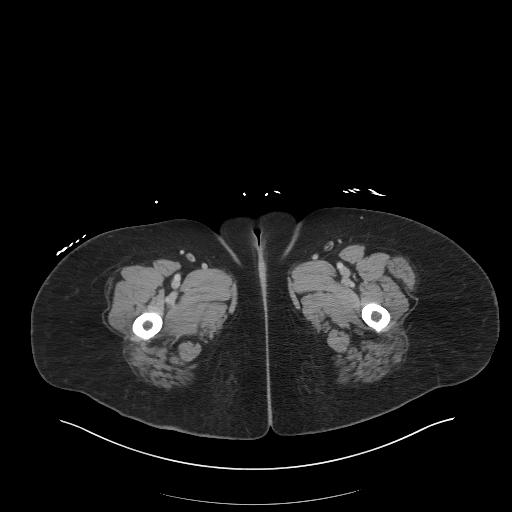
[im 6/104  bone]
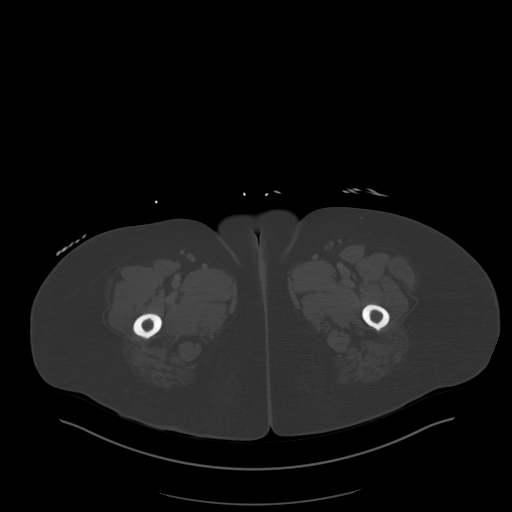
[im 16/104  soft-tissue]
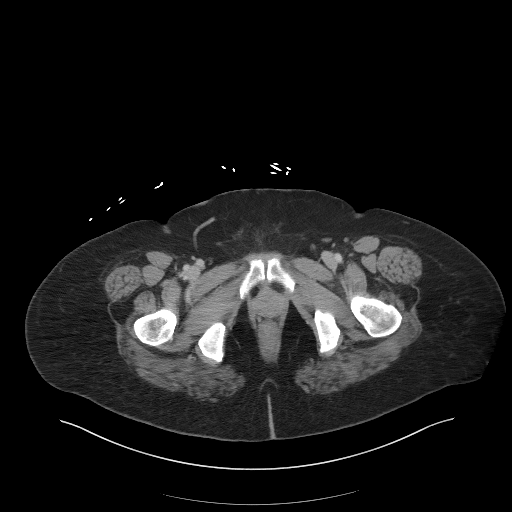
[im 21/104  soft-tissue]
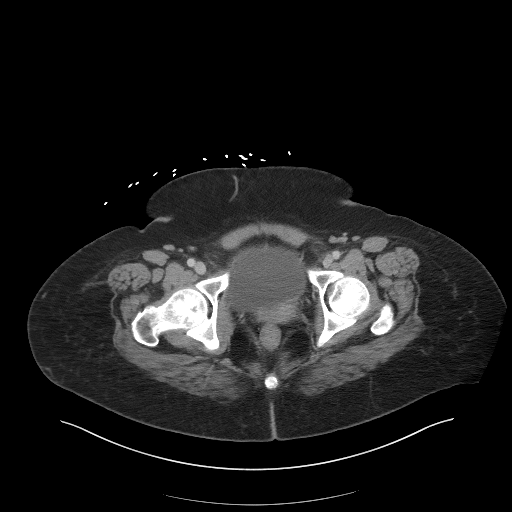
[im 31/104  soft-tissue]
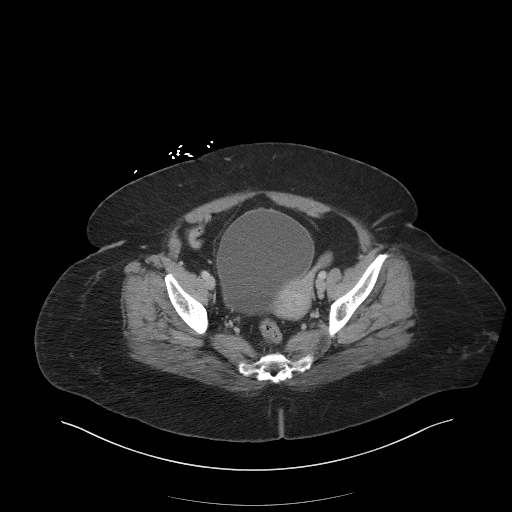
[im 37/104  soft-tissue]
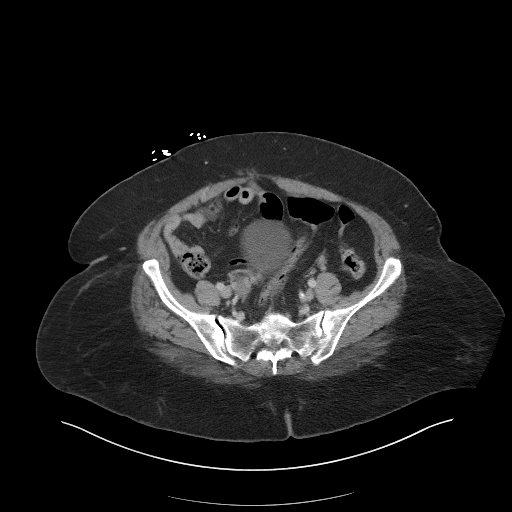
[im 47/104  soft-tissue]
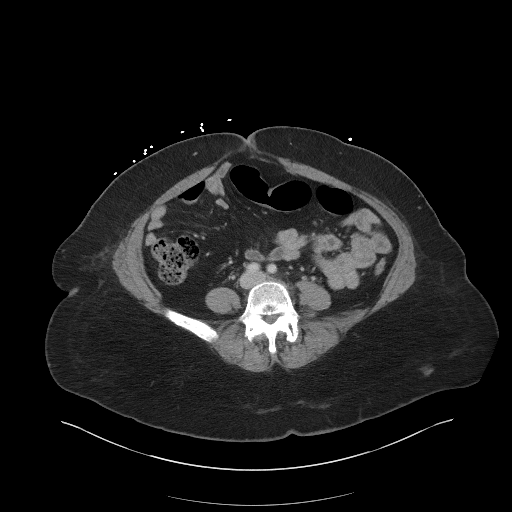
[im 52/104  soft-tissue]
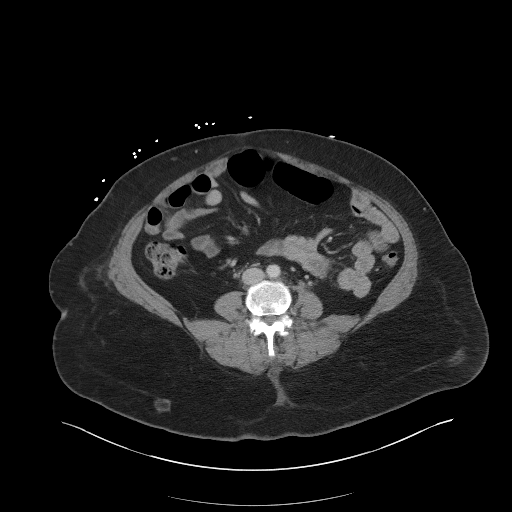
[im 57/104  soft-tissue]
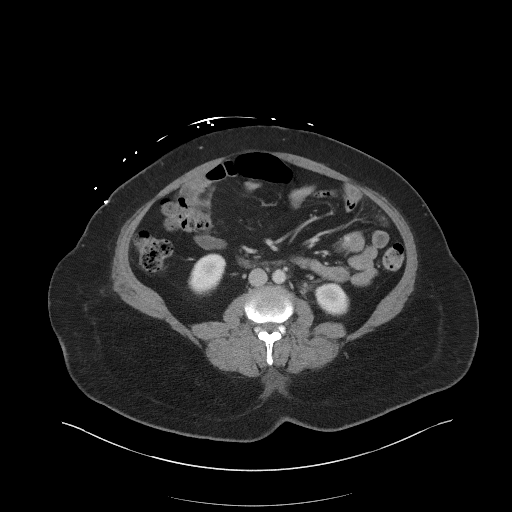
[im 67/104  soft-tissue]
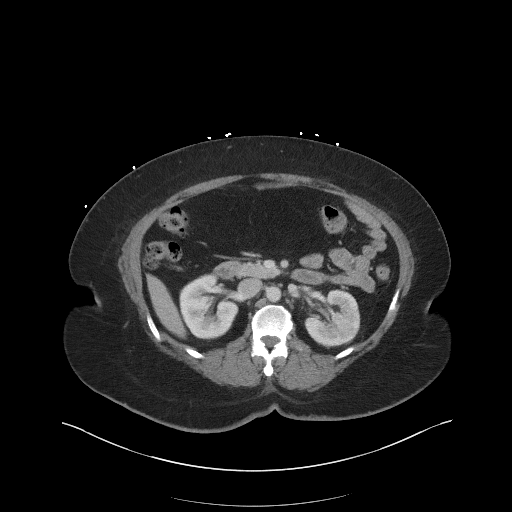
[im 67/104  bone]
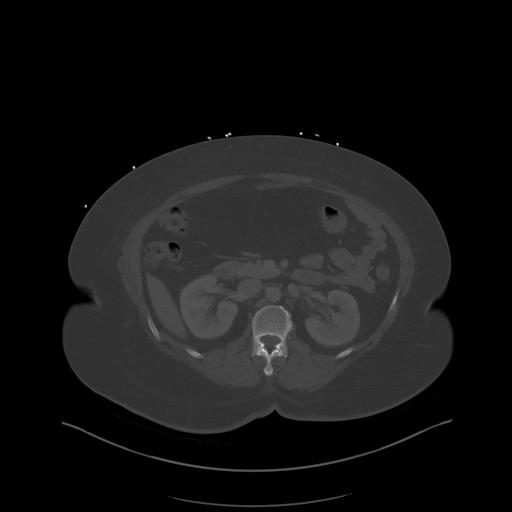
[im 73/104  soft-tissue]
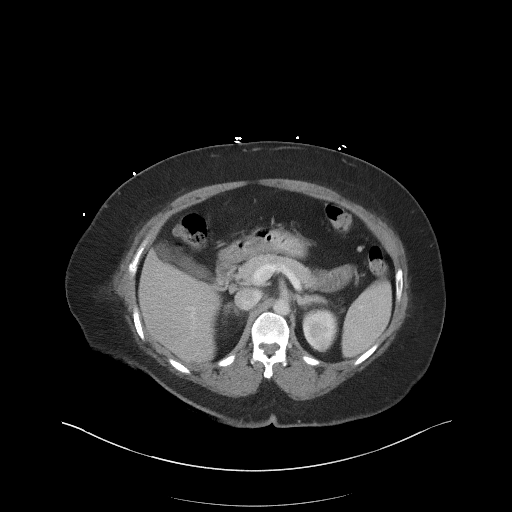
[im 83/104  soft-tissue]
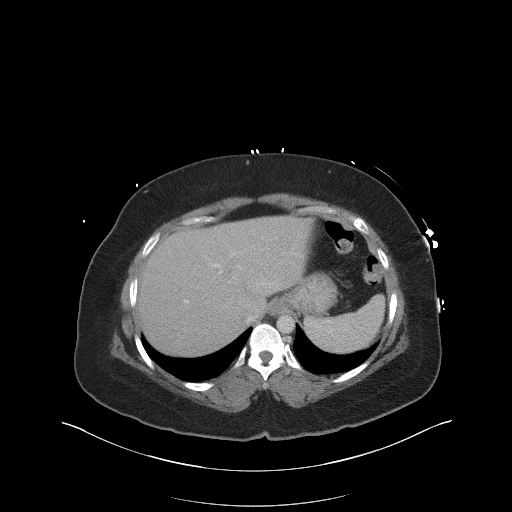
[im 88/104  soft-tissue]
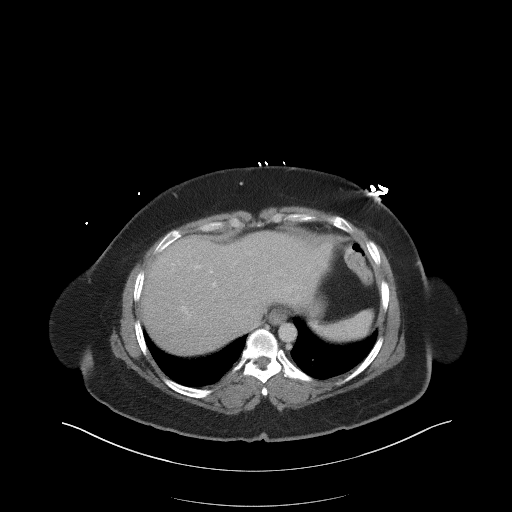
[im 98/104  soft-tissue]
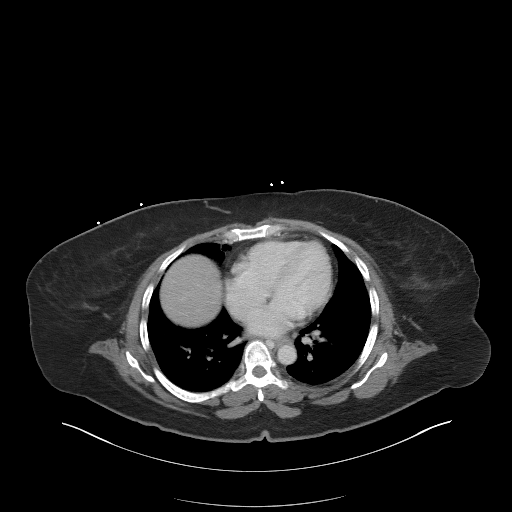

[Series 5: coronal st · coronal · 0.91mm/px · 3 of 102 slices shown]
[im 34/102  soft-tissue]
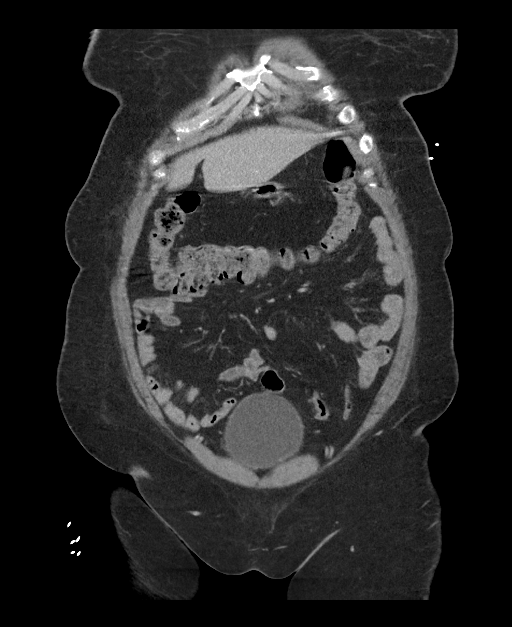
[im 45/102  soft-tissue]
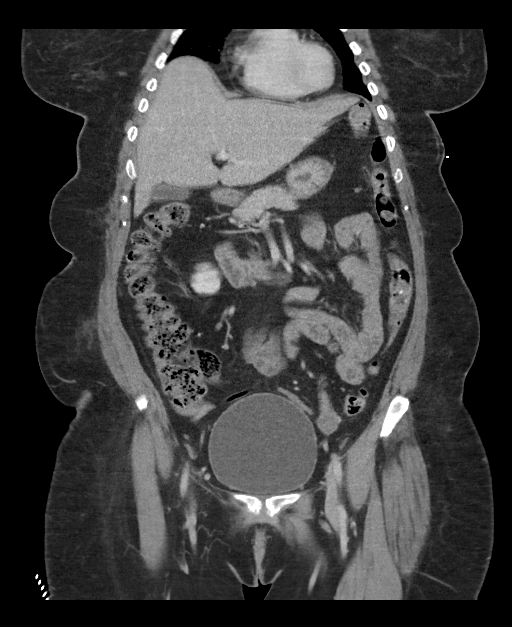
[im 57/102  soft-tissue]
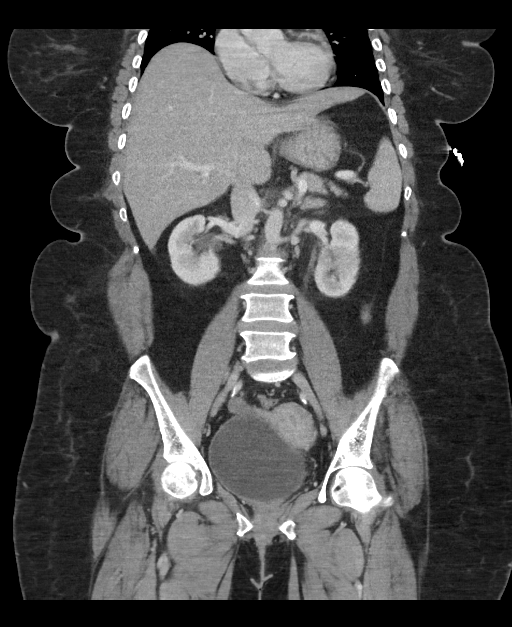

[16 of 46 positions shown; findings below may reference images not displayed]

FINDINGS: Lower chest: Lung bases clear

Hepatobiliary: Gallbladder and liver normal appearance

Pancreas: Normal appearance

Spleen: Normal appearance

Adrenals/Urinary Tract: Adrenal glands, kidneys, ureters, and
bladder normal appearance

Stomach/Bowel: Normal appendix. Stomach and bowel loops normal
appearance

Vascular/Lymphatic: Vascular structures patent. Aorta normal
caliber.

Reproductive: Uterus unremarkable. Normal sized ovaries bilaterally
without mass

Other: No free air or free fluid. Tiny infraumbilical ventral hernia
containing fat. No definite acute inflammatory process.

Musculoskeletal: No acute osseous findings. Multilevel mild AP
narrowing of spinal canal. BILATERAL neural foraminal stenoses L4-L5
and L5-S1 greater on RIGHT
IMPRESSION: No definite acute intra-abdominal or intrapelvic abnormalities.

Tiny infraumbilical hernia containing fat.
